# Patient Record
Sex: Female | Born: 1990 | Race: Black or African American | Hispanic: No | Marital: Single | Smoking: Never smoker
Health system: Southern US, Community
[De-identification: ages and names within clinical notes are randomized; demographics above are authoritative.]

## PROBLEM LIST (undated history)

## (undated) DIAGNOSIS — G039 Meningitis, unspecified: Secondary | ICD-10-CM

## (undated) HISTORY — PX: ANTERIOR CRUCIATE LIGAMENT REPAIR: SHX115

---
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
## 2011-01-31 NOTE — Op Note (Signed)
Wanda Reyes, PANCHAL NO.:  [id number]   MEDICAL RECORD NO.:  [id number]          PATIENT TYPE:  AMB   LOCATION:  DSC                          FACILITY:  [facility]   PHYSICIAN:  Mila Homer. Sherlean Foot, M.D. DATE OF BIRTH:  06-Nov-1990   DATE OF PROCEDURE:  10/09/2007  DATE OF DISCHARGE:                               OPERATIVE REPORT   SURGEON:  Mila Homer. Sherlean Foot, M.D.   ASSISTANTArlys John D. Petrarca, P.A.-C.   ANESTHESIA:  General.   PREOP DIAGNOSIS:  Left anterior cruciate ligament tear.   POSTOP DIAGNOSIS:  Left anterior cruciate ligament tear.   PROCEDURE:  Left ACL reconstruction (autograft bone and bone tendon  bone).   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  Informed consent was obtained for autograft ACL reconstruction  from the parents.  Informed consent was obtained from the parents.   DESCRIPTION OF PROCEDURE:  The patient was laid supine and administered  general anesthesia in the supine position.  Left leg was prepped and  draped in the usual sterile fashion.  The bed was broken so that both  knees were at 90 degrees and well-padded.  I then made an inferolateral  portal and took a tour of the knee.  We documented the ACL being torn  off the lateral wall but no chondromalacia or meniscus tears.  I then  removed the scope and exsanguinated the extremity with the Esmarch.  I  elevated the tourniquet to 350 mmHg and set it for an hour.   I then made a midline incision from the inferior pole of the patella to  the tibial tubercle with a #15 blade.  I used a new 15 blade to incise  the peritenon.  I then used a #10 catamaran blade to take the central  one-third of the patella tendon and used the __________  saw to take a  10 x 10 x 25 mm bone plug out of the tibial tubercle and the patella.  The PA then prepared the graft on the back table.  I then went  back  inside the knee with the scope.  I made an inferomedial portal through  the harvest incision.  I removed the old ACL.  I then checked the  menisci again and there were in fact no tears.   I then used the 4.0 mm cylindrical bur to perform aggressive  notchplasty.  I then used the Arthrex system to make our tibial and  femoral tunnels in standard position with the femoral tunnel being 30 mm  in length.  I then passed the graft with the beef needle and placed the  nitinol wire anterior to the bone plug in the femoral tunnel and placed  a 7 x 25 mm metal interference screw.  I then placed the nitinol wire  anterior to the tibial bone plug. We placed a 7 x 25 mm with the knee in  10 degrees of flexion, posterior drawer applied and tensioned on the  graft.  I then went back into the knee and both visually watching as I  did a Drawer as well as probing the ACL it appeared to be a very very  well tensioned and tight.  I then lavaged the knee, closed with some  bone graft in the patella, zero Vicryls in the paratenon, 0 Vicryls in  the buried deep soft tissues, two subcuticular 2-0 Vicryl sutures and  Steri-Strips.  The patient tolerated the procedure well.  I infiltrated  with some Marcaine and morphine mixture and dressed with Xeroform  dressing, sponges, sterile Webril and Ace wrap.   COMPLICATIONS:  None.   DRAINS:  None.           ______________________________  Mila Homer. Sherlean Foot, M.D.     SDL/MEDQ  D:  10/09/2007  T:  10/09/2007  Job:  [id number]

## 2011-06-08 LAB — POCT HEMOGLOBIN-HEMACUE: Hemoglobin: 13.8

## 2012-02-08 ENCOUNTER — Other Ambulatory Visit: Payer: Self-pay | Admitting: Orthopedic Surgery

## 2012-02-08 DIAGNOSIS — M25569 Pain in unspecified knee: Secondary | ICD-10-CM

## 2012-02-09 ENCOUNTER — Ambulatory Visit
Admission: RE | Admit: 2012-02-09 | Discharge: 2012-02-09 | Disposition: A | Discharge status: Home / Self Care | Payer: BC Managed Care – PPO | Source: Ambulatory Visit | Attending: Orthopedic Surgery | Admitting: Orthopedic Surgery

## 2012-02-09 DIAGNOSIS — M25569 Pain in unspecified knee: Secondary | ICD-10-CM

## 2021-05-09 DIAGNOSIS — Z01419 Encounter for gynecological examination (general) (routine) without abnormal findings: Secondary | ICD-10-CM | POA: Diagnosis not present

## 2021-05-09 DIAGNOSIS — R1011 Right upper quadrant pain: Secondary | ICD-10-CM | POA: Diagnosis not present

## 2021-05-09 DIAGNOSIS — N911 Secondary amenorrhea: Secondary | ICD-10-CM | POA: Diagnosis not present

## 2021-05-09 DIAGNOSIS — R102 Pelvic and perineal pain: Secondary | ICD-10-CM | POA: Diagnosis not present

## 2021-05-09 DIAGNOSIS — Z113 Encounter for screening for infections with a predominantly sexual mode of transmission: Secondary | ICD-10-CM | POA: Diagnosis not present

## 2021-05-09 DIAGNOSIS — Z6841 Body Mass Index (BMI) 40.0 and over, adult: Secondary | ICD-10-CM | POA: Diagnosis not present

## 2021-05-25 ENCOUNTER — Emergency Department (HOSPITAL_BASED_OUTPATIENT_CLINIC_OR_DEPARTMENT_OTHER)
Admission: EM | Admit: 2021-05-25 | Discharge: 2021-05-25 | Disposition: A | Discharge status: Home / Self Care | Payer: PRIVATE HEALTH INSURANCE | Attending: Emergency Medicine | Admitting: Emergency Medicine

## 2021-05-25 ENCOUNTER — Encounter (HOSPITAL_BASED_OUTPATIENT_CLINIC_OR_DEPARTMENT_OTHER): Payer: Self-pay

## 2021-05-25 ENCOUNTER — Other Ambulatory Visit: Payer: Self-pay

## 2021-05-25 DIAGNOSIS — R110 Nausea: Secondary | ICD-10-CM | POA: Diagnosis not present

## 2021-05-25 DIAGNOSIS — R1084 Generalized abdominal pain: Secondary | ICD-10-CM | POA: Diagnosis not present

## 2021-05-25 DIAGNOSIS — R197 Diarrhea, unspecified: Secondary | ICD-10-CM | POA: Diagnosis present

## 2021-05-25 LAB — CBC
HCT: 51 % — ABNORMAL HIGH (ref 36.0–46.0)
Hemoglobin: 17.5 g/dL — ABNORMAL HIGH (ref 12.0–15.0)
MCH: 28.8 pg (ref 26.0–34.0)
MCHC: 34.3 g/dL (ref 30.0–36.0)
MCV: 84 fL (ref 80.0–100.0)
Platelets: 289 10*3/uL (ref 150–400)
RBC: 6.07 MIL/uL — ABNORMAL HIGH (ref 3.87–5.11)
RDW: 12.3 % (ref 11.5–15.5)
WBC: 19 10*3/uL — ABNORMAL HIGH (ref 4.0–10.5)
nRBC: 0 % (ref 0.0–0.2)

## 2021-05-25 LAB — COMPREHENSIVE METABOLIC PANEL
ALT: 15 U/L (ref 0–44)
AST: 25 U/L (ref 15–41)
Albumin: 4.5 g/dL (ref 3.5–5.0)
Alkaline Phosphatase: 47 U/L (ref 38–126)
Anion gap: 7 (ref 5–15)
BUN: 10 mg/dL (ref 6–20)
CO2: 26 mmol/L (ref 22–32)
Calcium: 9.4 mg/dL (ref 8.9–10.3)
Chloride: 104 mmol/L (ref 98–111)
Creatinine, Ser: 1.14 mg/dL — ABNORMAL HIGH (ref 0.44–1.00)
GFR, Estimated: >60 mL/min (ref >60)
Glucose, Bld: 107 mg/dL — ABNORMAL HIGH (ref 70–99)
Potassium: 3.7 mmol/L (ref 3.5–5.1)
Sodium: 137 mmol/L (ref 135–145)
Total Bilirubin: 0.6 mg/dL (ref 0.3–1.2)
Total Protein: 8.1 g/dL (ref 6.5–8.1)

## 2021-05-25 LAB — LIPASE, BLOOD: Lipase: 33 U/L (ref 11–51)

## 2021-05-25 MED ORDER — LACTATED RINGERS IV BOLUS
1000.0000 mL | Freq: Once | INTRAVENOUS | Status: AC
  Administered 2021-05-25: 1000 mL via INTRAVENOUS

## 2021-05-25 MED ORDER — ONDANSETRON 4 MG PO TBDP: 4.0000 mg | ORAL_TABLET | Freq: Three times a day (TID) | ORAL | 0 refills | Status: AC | PRN

## 2021-05-25 NOTE — ED Provider Notes (Signed)
[facility] EMERGENCY DEPARTMENT Provider Note   CSN: [id number] Arrival date & time: 05/25/21  1015     History Chief Complaint  Patient presents with   Diarrhea    Wanda Reyes is a 30 y.o. female.   Diarrhea Associated symptoms: no abdominal pain, no arthralgias, no chills, no diaphoresis, no fever, no headaches, no myalgias and no vomiting   Patient presents for diarrhea.  Onset was 0600.  Diarrhea is described as watery.  She has had nausea but has not vomited.  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  While at work, she had continued watery diarrhea.  Frequency of her chest to the bathroom got so frequent that she decided to leave work.  She presents to the ED due to the persistent symptoms.  Patient states that during these episodes of diarrhea, she would have some crampy generalized abdominal pain.  She states that currently, her abdominal pain is subsided.  She denies any current nausea.  She does not have any history of GI abnormalities.  She typically does not get diarrhea.  As far as suspicious food, she does state that she had a chicken salad from [employer] and, in hindsight, the chicken did not taste right.  She is also been experimenting with restriction diet and cutting out a lot of foods.  She broke these restrictions last night and had some pizza.  She denies any recent urinary symptoms.  She was diagnosed with chlamydia 2 weeks ago.  She completed a course of doxycycline with the last dose being on Thursday.    History reviewed. No pertinent past medical history.  There are no problems to display for this patient.   History reviewed. No pertinent surgical history.   OB History   No obstetric history on file.     History reviewed. No pertinent family history.     Home Medications Prior to Admission medications   Medication Sig Start Date End Date Taking? Authorizing Provider  ondansetron (ZOFRAN ODT) 4 MG disintegrating tablet Take 1  tablet (4 mg total) by mouth every 8 (eight) hours as needed for up to 5 doses for nausea or vomiting. 05/25/21  Yes Gloris Manchester, MD    Allergies    Patient has no known allergies.  Review of Systems   Review of Systems  Constitutional:  Negative for activity change, chills, diaphoresis, fatigue and fever.  HENT:  Negative for ear pain and sore throat.   Eyes:  Negative for pain and visual disturbance.  Respiratory:  Negative for cough and shortness of breath.   Cardiovascular:  Negative for chest pain and palpitations.  Gastrointestinal:  Positive for diarrhea and nausea. Negative for abdominal distention, abdominal pain, blood in stool and vomiting.  Genitourinary:  Negative for dysuria, flank pain, frequency, hematuria, pelvic pain, urgency and vaginal discharge.  Musculoskeletal:  Negative for arthralgias, back pain, myalgias and neck pain.  Skin:  Negative for color change and rash.  Neurological:  Negative for dizziness, seizures, syncope, weakness, light-headedness, numbness and headaches.  All other systems reviewed and are negative.  Physical Exam Updated Vital Signs BP 110/81 (BP Location: Right Arm)   Pulse 77   Temp 98.3 F (36.8 C) (Oral)   Resp 18   Wt 105.7 kg   SpO2 100%   Physical Exam Vitals and nursing note reviewed.  Constitutional:      General: She is not in acute distress.    Appearance: Normal appearance. She is well-developed. She  is not ill-appearing, toxic-appearing or diaphoretic.  HENT:     Head: Normocephalic and atraumatic.     Right Ear: External ear normal.     Left Ear: External ear normal.     Nose: Nose normal.     Mouth/Throat:     Mouth: Mucous membranes are moist.     Pharynx: Oropharynx is clear.  Eyes:     Conjunctiva/sclera: Conjunctivae normal.  Cardiovascular:     Rate and Rhythm: Normal rate and regular rhythm.     Heart sounds: No murmur heard. Pulmonary:     Effort: Pulmonary effort is normal. No respiratory distress.      Breath sounds: Normal breath sounds.  Abdominal:     General: There is no distension.     Palpations: Abdomen is soft.     Tenderness: There is no abdominal tenderness.  Musculoskeletal:     Cervical back: Neck supple.     Right lower leg: No edema.     Left lower leg: No edema.  Skin:    General: Skin is warm and dry.     Coloration: Skin is not jaundiced or pale.  Neurological:     General: No focal deficit present.     Mental Status: She is alert and oriented to person, place, and time.     Cranial Nerves: No cranial nerve deficit.     Sensory: No sensory deficit.     Motor: No weakness.  Psychiatric:        Mood and Affect: Mood normal.        Behavior: Behavior normal.        Thought Content: Thought content normal.        Judgment: Judgment normal.    ED Results / Procedures / Treatments   Labs (all labs ordered are listed, but only abnormal results are displayed) Labs Reviewed  COMPREHENSIVE METABOLIC PANEL - Abnormal; Notable for the following components:      Result Value   Glucose, Bld 107 (*)    Creatinine, Ser 1.14 (*)    All other components within normal limits  CBC - Abnormal; Notable for the following components:   WBC 19.0 (*)    RBC 6.07 (*)    Hemoglobin 17.5 (*)    HCT 51.0 (*)    All other components within normal limits  LIPASE, BLOOD    EKG None  Radiology No results found.  Procedures Procedures   Medications Ordered in ED Medications  lactated ringers bolus 1,000 mL (0 mLs Intravenous Stopped 05/25/21 1230)    ED Course  I have reviewed the triage vital signs and the nursing notes.  Pertinent labs & imaging results that were available during my care of the patient were reviewed by me and considered in my medical decision making (see chart for details).    MDM Rules/Calculators/A&P                           Patient presents for 5 hours of watery diarrhea.  She has had associated generalized crampy abdominal pain as well as some  nausea without vomiting.  She denies any fevers, chills, urinary symptoms, or any other areas of discomfort.  Well-appearing upon arrival.  Vital signs are reassuring.  Laboratory work-up was initiated to assess for electrolyte abnormalities.  Patient has had decreased p.o. intake today and so IV fluids were ordered to replace fluid losses.  On arrival, patient declined any antiemetic due to improved  symptoms.  Labs showed normal electrolytes.  Patient's creatinine was 1.14, which was slightly increased from labs taken 2 weeks ago, at which time creatinine was 1.05.  CBC showed a leukocytosis of 19.0.  Her hemoglobin was also elevated from baseline and there is likely some hemoconcentration present, however, this would not fully explain her high white count.  She has not had any recent antibiotic use other than doxycycline.  Given the acute onset of her symptoms today, low suspicion for C. difficile.  On reassessment, patient reported resolution of abdominal pain, resolution of nausea, and improved diarrhea.  During her stay in the ED, she had only 1 episode of diarrhea over 4 hours.  She was informed of her elevated white count.  She was encouraged to follow-up with her primary care doctor for recheck of labs and possible stool studies.  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  I do feel that she is reliable to follow-up with her regular doctor and/or return to the ED for any continued symptoms.  Given her improvement while in the ED, patient did feel comfortable going home.  She was discharged in stable condition.  Final Clinical Impression(s) / ED Diagnoses Final diagnoses:  Diarrhea, unspecified type    Rx / DC Orders ED Discharge Orders          Ordered    ondansetron (ZOFRAN ODT) 4 MG disintegrating tablet  Every 8 hours PRN        05/25/21 1347             Gloris Manchester, MD 05/26/21 1659

## 2021-05-25 NOTE — ED Triage Notes (Signed)
Diarrhea that started about 0600 today, went about 5 times. Abdominal pain with it as well.

## 2021-12-19 ENCOUNTER — Emergency Department (HOSPITAL_BASED_OUTPATIENT_CLINIC_OR_DEPARTMENT_OTHER)
Admission: EM | Admit: 2021-12-19 | Discharge: 2021-12-19 | Disposition: A | Discharge status: Home / Self Care | Payer: PRIVATE HEALTH INSURANCE | Attending: Emergency Medicine | Admitting: Emergency Medicine

## 2021-12-19 ENCOUNTER — Emergency Department (HOSPITAL_BASED_OUTPATIENT_CLINIC_OR_DEPARTMENT_OTHER): Payer: PRIVATE HEALTH INSURANCE

## 2021-12-19 ENCOUNTER — Other Ambulatory Visit: Payer: Self-pay

## 2021-12-19 ENCOUNTER — Encounter (HOSPITAL_BASED_OUTPATIENT_CLINIC_OR_DEPARTMENT_OTHER): Payer: Self-pay

## 2021-12-19 DIAGNOSIS — R1030 Lower abdominal pain, unspecified: Secondary | ICD-10-CM

## 2021-12-19 DIAGNOSIS — Z8742 Personal history of other diseases of the female genital tract: Secondary | ICD-10-CM | POA: Insufficient documentation

## 2021-12-19 DIAGNOSIS — N898 Other specified noninflammatory disorders of vagina: Secondary | ICD-10-CM | POA: Diagnosis not present

## 2021-12-19 DIAGNOSIS — D582 Other hemoglobinopathies: Secondary | ICD-10-CM | POA: Diagnosis not present

## 2021-12-19 HISTORY — DX: Meningitis, unspecified: G03.9

## 2021-12-19 LAB — WET PREP, GENITAL
Clue Cells Wet Prep HPF POC: NONE SEEN
Sperm: NONE SEEN
Trich, Wet Prep: NONE SEEN
WBC, Wet Prep HPF POC: <10 (ref <10)
Yeast Wet Prep HPF POC: NONE SEEN

## 2021-12-19 LAB — CBC WITH DIFFERENTIAL/PLATELET
Abs Immature Granulocytes: 0.01 10*3/uL (ref 0.00–0.07)
Basophils Absolute: 0.1 10*3/uL (ref 0.0–0.1)
Basophils Relative: 1 %
Eosinophils Absolute: 0.1 10*3/uL (ref 0.0–0.5)
Eosinophils Relative: 1 %
HCT: 49.6 % — ABNORMAL HIGH (ref 36.0–46.0)
Hemoglobin: 17.6 g/dL — ABNORMAL HIGH (ref 12.0–15.0)
Immature Granulocytes: 0 %
Lymphocytes Relative: 31 %
Lymphs Abs: 2 10*3/uL (ref 0.7–4.0)
MCH: 29.6 pg (ref 26.0–34.0)
MCHC: 35.5 g/dL (ref 30.0–36.0)
MCV: 83.4 fL (ref 80.0–100.0)
Monocytes Absolute: 0.5 10*3/uL (ref 0.1–1.0)
Monocytes Relative: 8 %
Neutro Abs: 3.9 10*3/uL (ref 1.7–7.7)
Neutrophils Relative %: 59 %
Platelets: 235 10*3/uL (ref 150–400)
RBC: 5.95 MIL/uL — ABNORMAL HIGH (ref 3.87–5.11)
RDW: 12.7 % (ref 11.5–15.5)
WBC: 6.6 10*3/uL (ref 4.0–10.5)
nRBC: 0 % (ref 0.0–0.2)

## 2021-12-19 LAB — COMPREHENSIVE METABOLIC PANEL
ALT: 21 U/L (ref 0–44)
AST: 27 U/L (ref 15–41)
Albumin: 3.8 g/dL (ref 3.5–5.0)
Alkaline Phosphatase: 44 U/L (ref 38–126)
Anion gap: 6 (ref 5–15)
BUN: 12 mg/dL (ref 6–20)
CO2: 26 mmol/L (ref 22–32)
Calcium: 9.1 mg/dL (ref 8.9–10.3)
Chloride: 104 mmol/L (ref 98–111)
Creatinine, Ser: 1.08 mg/dL — ABNORMAL HIGH (ref 0.44–1.00)
GFR, Estimated: >60 mL/min (ref >60)
Glucose, Bld: 124 mg/dL — ABNORMAL HIGH (ref 70–99)
Potassium: 3.6 mmol/L (ref 3.5–5.1)
Sodium: 136 mmol/L (ref 135–145)
Total Bilirubin: 0.8 mg/dL (ref 0.3–1.2)
Total Protein: 7.5 g/dL (ref 6.5–8.1)

## 2021-12-19 LAB — URINALYSIS, ROUTINE W REFLEX MICROSCOPIC
Bilirubin Urine: NEGATIVE
Glucose, UA: NEGATIVE mg/dL
Hgb urine dipstick: NEGATIVE
Ketones, ur: NEGATIVE mg/dL
Nitrite: NEGATIVE
Protein, ur: NEGATIVE mg/dL
Specific Gravity, Urine: 1.015 (ref 1.005–1.030)
pH: 7 (ref 5.0–8.0)

## 2021-12-19 LAB — PREGNANCY, URINE: Preg Test, Ur: NEGATIVE

## 2021-12-19 LAB — URINALYSIS, MICROSCOPIC (REFLEX)

## 2021-12-19 LAB — LIPASE, BLOOD: Lipase: 32 U/L (ref 11–51)

## 2021-12-19 MED ORDER — KETOROLAC TROMETHAMINE 60 MG/2ML IM SOLN: 60.0000 mg | Freq: Once | INTRAMUSCULAR | Status: DC

## 2021-12-19 MED ORDER — KETOROLAC TROMETHAMINE 30 MG/ML IJ SOLN
30.0000 mg | Freq: Once | INTRAMUSCULAR | Status: AC
  Administered 2021-12-19: 30 mg via INTRAVENOUS
  Filled 2021-12-19: qty 1

## 2021-12-19 NOTE — ED Triage Notes (Signed)
Pt arrives ambulatory to ED with reports of severe cramping, states that she was been seen by her OB in the past and told she has PCOS. Pt reports she has not had a cycle in 10 months. Pt reports negative pregnancy test.  ?

## 2021-12-19 NOTE — ED Notes (Signed)
Called [facility] to inform of patient transferring for [place]. ?

## 2021-12-19 NOTE — ED Provider Notes (Addendum)
?[facility] EMERGENCY DEPARTMENT ?Provider Note ? ? ?CSN: [id number] ?Arrival date & time: 12/19/21  8250 ? ?  ? ?History ? ?Chief Complaint  ?Patient presents with  ? Abdominal Pain  ? ? ?Wanda Reyes is a 31 y.o. female. ? ?HPI ? ?  ? ?31 year old female with history of PCOS presents with concern for lower abdominal cramping. ? ?Clear discharge, no odor ? ?5-7 minute schedule, sharp stabbing pains over ovaries and into uterus, and sometimes feels like vaginal wall contracting. Maybe a little worse on left side.  Has history of this happening for 6-8 hour spans once a month for several/3 years (started late 2020) But this week it has been going on since last Sunday.  During these episodes has difficulty urinating.  Feels a stinging on both sides. 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  ? ?Some constipation but having regular BM ?No nausea or vomiting  ?No fevers ?No vaginal bleeding ?No pain into back, a little achy but not significantly ? ?Home Medications ?Prior to Admission medications   ?Medication Sig Start Date End Date Taking? Authorizing Provider  ?ondansetron (ZOFRAN ODT) 4 MG disintegrating tablet Take 1 tablet (4 mg total) by mouth every 8 (eight) hours as needed for up to 5 doses for nausea or vomiting. 05/25/21   Gloris Manchester, MD  ?   ? ?Allergies    ?Patient has no known allergies.   ? ?Review of Systems   ?Review of Systems ?See above ?Physical Exam ?Updated Vital Signs ?BP (!) 155/104 (BP Location: Left Arm)   Pulse 77   Temp 97.7 ?F (36.5 ?C) (Oral)   Resp 18   Ht 5\' 2"  (1.575 m)   Wt 104.8 kg   LMP 02/16/2021 Comment: neg upreg in er today.  SpO2 100%   BMI 42.25 kg/m?  ?Physical Exam ?Vitals and nursing note reviewed.  ?Constitutional:   ?   General: She is not in acute distress. ?   Appearance: She is well-developed. She is not  diaphoretic.  ?HENT:  ?   Head: Normocephalic and atraumatic.  ?Eyes:  ?   Conjunctiva/sclera: Conjunctivae normal.  ?Cardiovascular:  ?   Rate and Rhythm: Normal rate and regular rhythm.  ?   Heart sounds: Normal heart sounds. No murmur heard. ?  No friction rub. No gallop.  ?Pulmonary:  ?   Effort: Pulmonary effort is normal. No respiratory distress.  ?   Breath sounds: Normal breath sounds. No wheezing or rales.  ?Abdominal:  ?   General: There is no distension.  ?   Palpations: Abdomen is soft.  ?   Tenderness: There is abdominal tenderness (mild suprapubic). There is no guarding.  ?Genitourinary: ?   Vagina: Vaginal discharge present.  ?   Cervix: Discharge present. No cervical motion tenderness.  ?   Uterus: Not enlarged and not tender.   ?   Adnexa:     ?   Right: No tenderness.      ?   Left: No tenderness.    ?Musculoskeletal:     ?   General: No tenderness.  ?   Cervical back: Normal range of motion.  ?Skin: ?   General: Skin is warm and dry.  ?   Findings: No erythema or rash.  ?Neurological:  ?   Mental Status: She is alert and  oriented to person, place, and time.  ? ? ?ED Results / Procedures / Treatments   ?Labs ?(all labs ordered are listed, but only abnormal results are displayed) ?Labs Reviewed  ?URINALYSIS, ROUTINE W REFLEX MICROSCOPIC - Abnormal; Notable for the following components:  ?    Result Value  ? Leukocytes,Ua SMALL (*)   ? All other components within normal limits  ?CBC WITH DIFFERENTIAL/PLATELET - Abnormal; Notable for the following components:  ? RBC 5.95 (*)   ? Hemoglobin 17.6 (*)   ? HCT 49.6 (*)   ? All other components within normal limits  ?COMPREHENSIVE METABOLIC PANEL - Abnormal; Notable for the following components:  ? Glucose, Bld 124 (*)   ? Creatinine, Ser 1.08 (*)   ? All other components within normal limits  ?URINALYSIS, MICROSCOPIC (REFLEX) - Abnormal; Notable for the following components:  ? Bacteria, UA RARE (*)   ? All other components within normal limits  ?WET  PREP, GENITAL  ?PREGNANCY, URINE  ?LIPASE, BLOOD  ?GC/CHLAMYDIA PROBE AMP [facility]  ? ? ?EKG ?None ? ?Radiology ?CT Renal Stone Study ? ?Result Date: 12/19/2021 ?CLINICAL DATA:  Flank pain, kidney stone suspected. One week of pelvic cramping. History of PCOS. EXAM: CT ABDOMEN AND PELVIS WITHOUT CONTRAST TECHNIQUE: Multidetector CT imaging of the abdomen and pelvis was performed following the standard protocol without IV contrast. RADIATION DOSE REDUCTION: This exam was performed according to the departmental dose-optimization program which includes automated exposure control, adjustment of the mA and/or kV according to patient size and/or use of iterative reconstruction technique. COMPARISON:  Abdominal ultrasound May 30, 2021 FINDINGS: Lower chest: No acute abnormality. Hepatobiliary: No suspicious hepatic lesion. Gallbladder is unremarkable. No biliary ductal dilation. Pancreas: No pancreatic ductal dilation or evidence of acute inflammation. Spleen: No splenomegaly or focal splenic lesion. Adrenals/Urinary Tract: Bilateral adrenal glands appear normal. No hydronephrosis. No renal, ureteral or bladder calculi identified. The right kidney appears slightly hypodense in comparison to the left which is nonspecific and may reflect sequela of recently passed stone, ascending urinary tract infection or artifact. Symmetric wall thickening of an incompletely distended urinary bladder. Stomach/Bowel: Stomach is unremarkable for degree of distension. No pathologic dilation of small or large bowel. The appendix and terminal ileum appear normal. No evidence of acute bowel inflammation. Vascular/Lymphatic: Normal caliber abdominal aorta. No pathologically enlarged abdominal or pelvic lymph nodes. Reproductive: Uterus and bilateral adnexa are unremarkable in noncontrast CT appearance for reproductive age female. Other: No significant abdominopelvic free fluid. Musculoskeletal: No acute or significant osseous  findings. IMPRESSION: 1. The right kidney appears slightly hypodense in comparison to the left which is nonspecific and may reflect sequela of recently passed stone, ascending urinary tract infection or artifact. Correlation with urinalysis is recommended. 2. Symmetric wall thickening of an incompletely distended urinary bladder. Correlate with urinalysis to exclude cystitis. Electronically Signed   By: Maudry MayhewJeffrey  Waltz M.D.   On: 12/19/2021 10:15   ? ?Procedures ?Procedures  ? ? ?Medications Ordered in ED ?Medications  ?ketorolac (TORADOL) 30 MG/ML injection 30 mg (30 mg Intravenous Given 12/19/21 0936)  ? ? ?ED Course/ Medical Decision Making/ A&P ?  ?                        ?Medical Decision Making ?Amount and/or Complexity of Data Reviewed ?Labs: ordered. ?Radiology: ordered. ? ?Risk ?Prescription drug management. ? ? ?31 year old female with history of PCOS presents with concern for lower abdominal cramping.  ? ?DDx  includes appendicitis, pancreatitis, cholecystitis, pyelonephritis, nephrolithiasis, diverticulitis, PID, ovarian torsion, ectopic pregnancy, and tuboovarian abscess.  We do not currently have [place] available and given one week of symptoms at this time will start with labs and CT to evaluate for nephrolithiasis. ? ?UA negative. Pregnancy test negative.  ?Labs personally evaluated and interpreted by me show no significant acute abnormalities, mildly elevated hgb, no sign of pancreatitis or hepatitis.  ? ?CT stone study shows right kidney appearing slightly hypodense in comparison to the left which is nonspecific and may reflect sequela of recently passed stone or ascending urinary tract infection, and symmetric wall thickening evident incompletely distended urinary bladder.  Her urinalysis is not consistent with a urinary tract infection.  Do recommend primary care physician follow-up regarding these findings. ? ?Pelvic exam with discharge but without significant uterine tenderness to suggest PID. ? ?Given  her history of PCOS and severe waxing and waning pelvic pain, do feel it is prudent to get a ultrasound.  Discussed with patient that given 1 week of symptoms, no current ovarian tenderness, my overall suspicion for

## 2021-12-20 LAB — GC/CHLAMYDIA PROBE AMP (~~LOC~~) NOT AT ARMC
Chlamydia: NEGATIVE
Comment: NEGATIVE
Comment: NORMAL
Neisseria Gonorrhea: NEGATIVE

## 2023-11-27 IMAGING — US US PELVIS COMPLETE TRANSABD/TRANSVAG W DUPLEX AND/OR DOPPLER
1 series · 13 of 25 positions shown · non-contrast
Comparison: Same day CT

CLINICAL DATA: Abdominal pain, cramping

EXAM:
TRANSABDOMINAL AND TRANSVAGINAL ULTRASOUND OF PELVIS
DOPPLER ULTRASOUND OF OVARIES
TECHNIQUE: Both transabdominal and transvaginal ultrasound examinations of the
pelvis were performed. Transabdominal technique was performed for
global imaging of the pelvis including uterus, ovaries, adnexal
regions, and pelvic cul-de-sac.
It was necessary to proceed with endovaginal exam following the
transabdominal exam to visualize the ovaries. Color and duplex
Doppler ultrasound was utilized to evaluate blood flow to the
ovaries.

[Series 1: us pelvis complete transabd/transvag w duplex and/ · 13 of 102 slices shown]
[im 1/102]
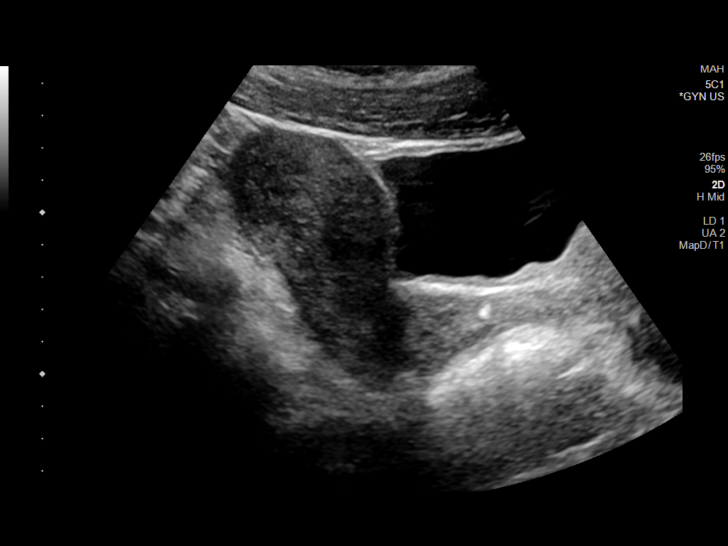
[im 9/102]
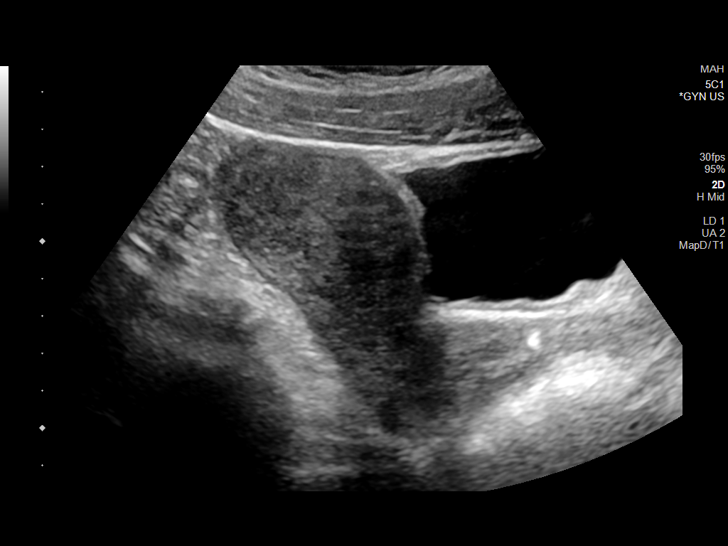
[im 17/102]
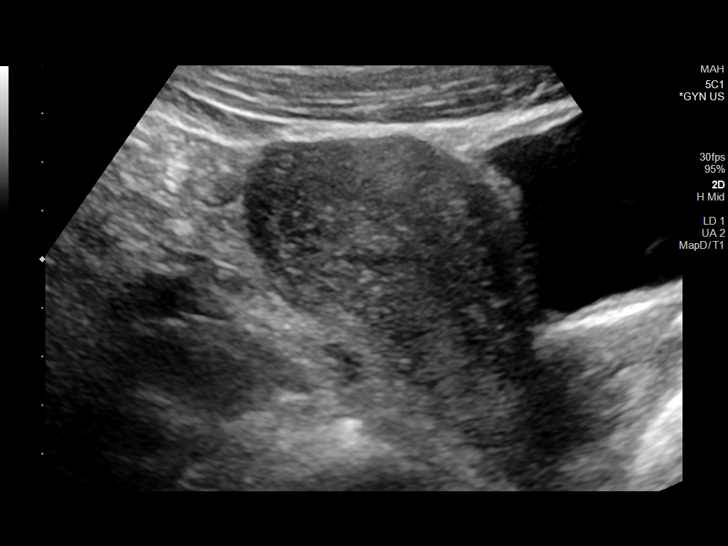
[im 26/102]
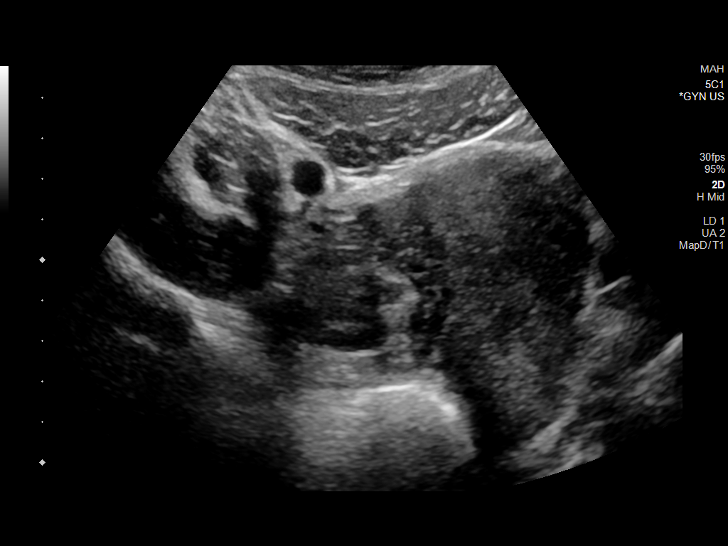
[im 34/102]
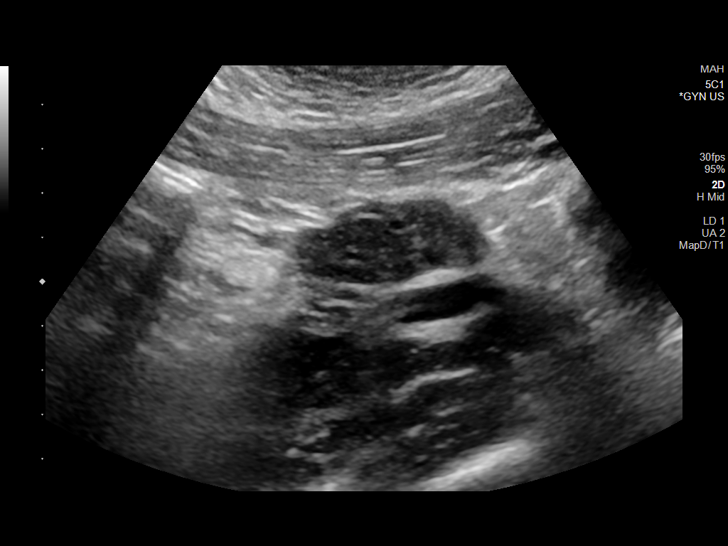
[im 43/102]
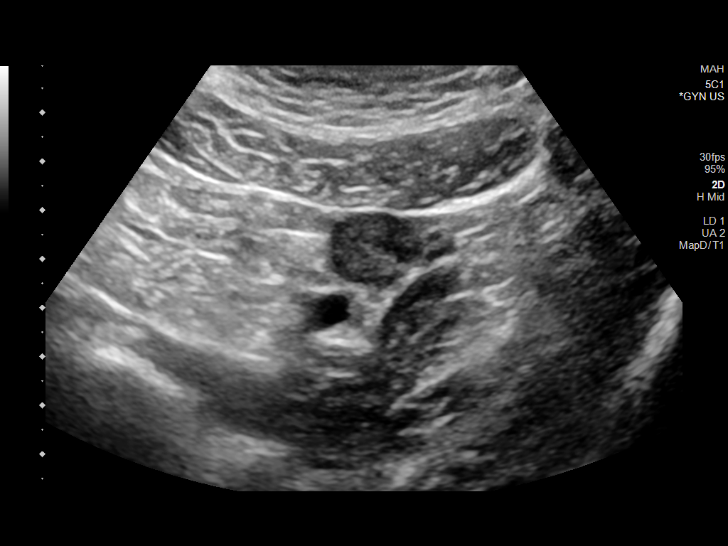
[im 51/102]
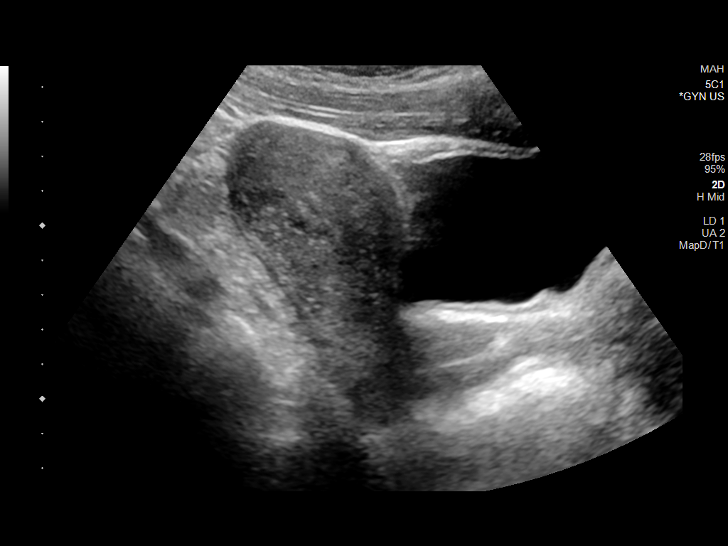
[im 59/102]
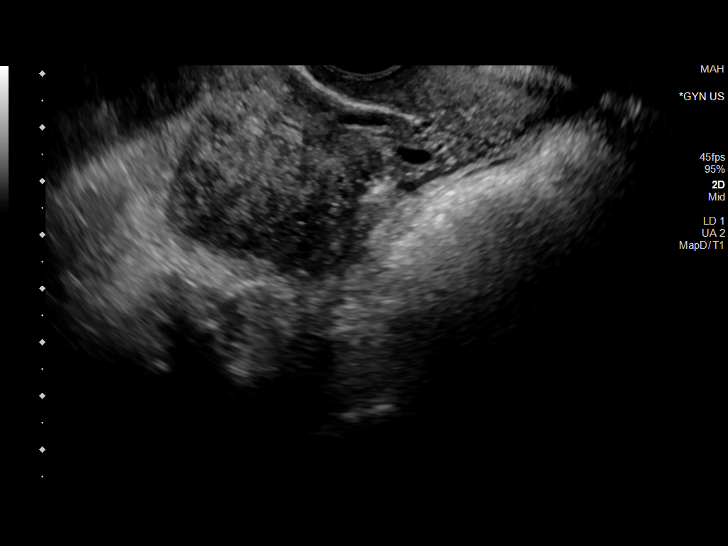
[im 68/102]
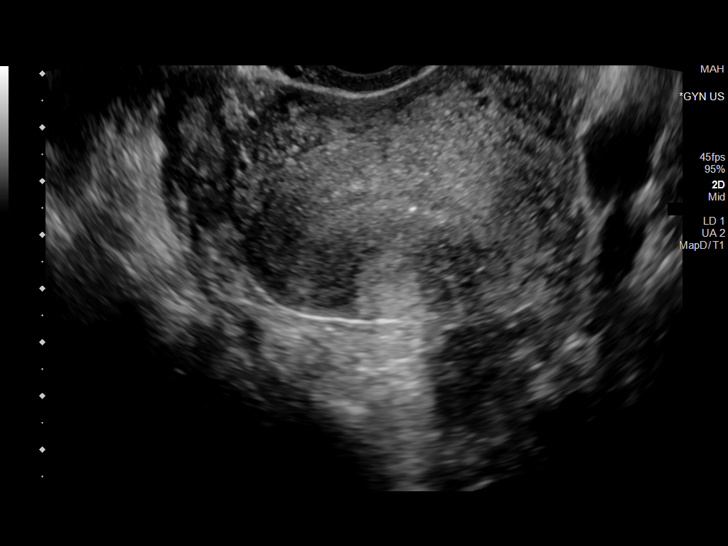
[im 76/102]
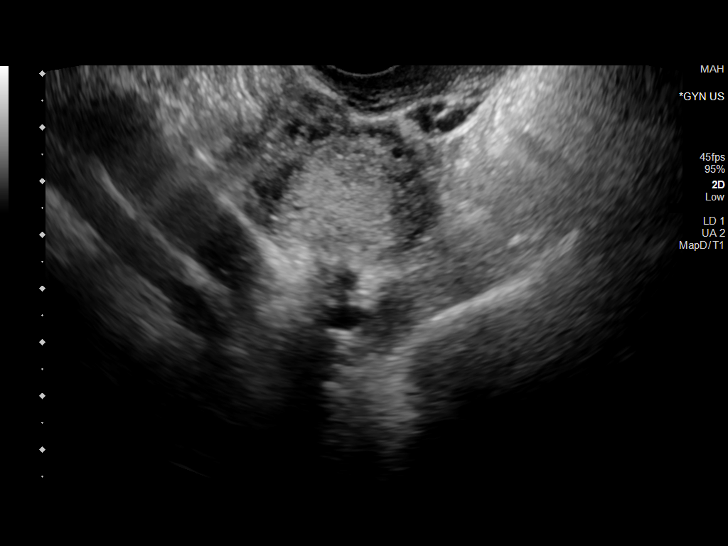
[im 85/102]
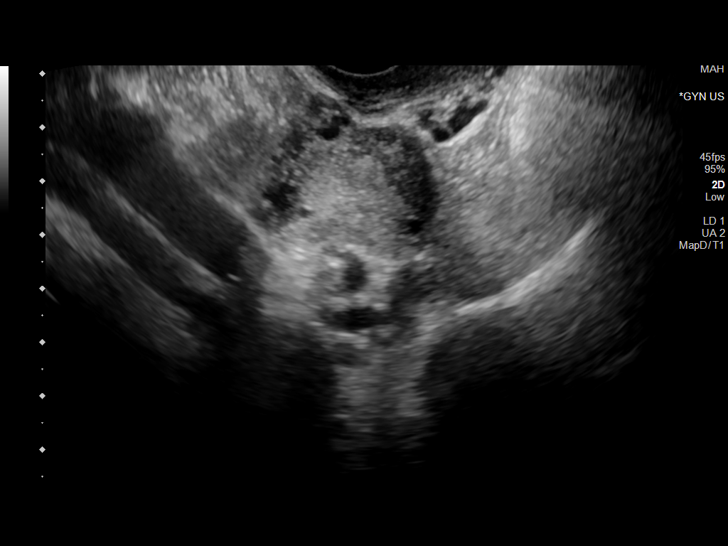
[im 93/102]
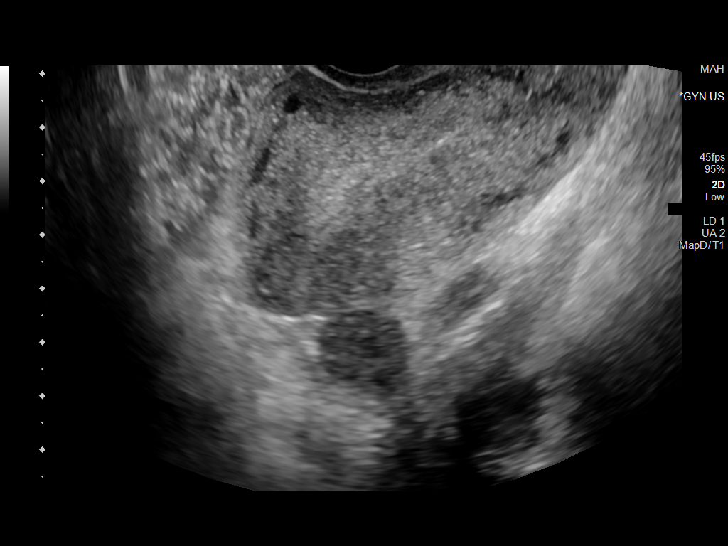
[im 102/102]
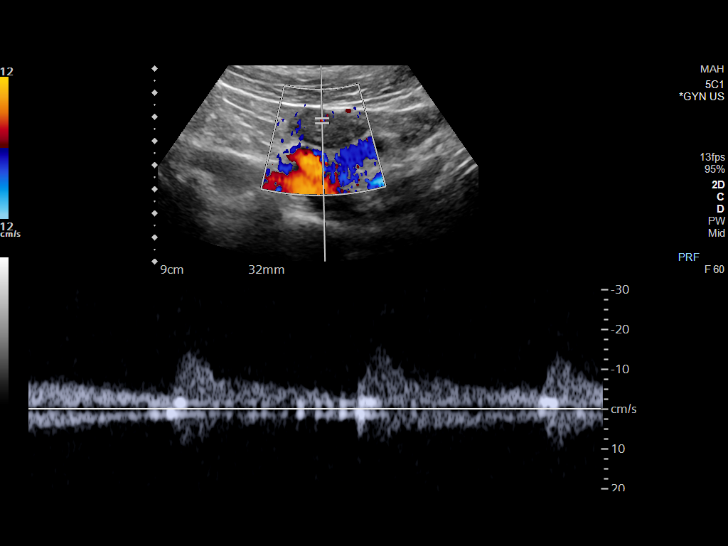

[13 of 25 positions shown; findings below may reference images not displayed]

FINDINGS: Uterus

Measurements: 9.4 x 4.3 x 6.7 cm = volume: 142 mL. No fibroids or
other mass visualized.

Endometrium

Thickness: 5 mm.  No focal abnormality visualized.

Right ovary

Measurements: 4.1 x 2.1 x 3.1 cm = volume: 14 mL. Normal
appearance/no adnexal mass.

Left ovary

Measurements: 3.2 x 1.8 x 1.7 cm = volume: 8 mL. Normal
appearance/no adnexal mass.

Pulsed Doppler evaluation of both ovaries demonstrates normal
low-resistance arterial and venous waveforms.

Other findings

No abnormal free fluid.
IMPRESSION: Normal ultrasound of the pelvis.  No evidence of adnexal torsion.

## 2023-11-27 IMAGING — CT CT RENAL STONE PROTOCOL
2 of 4 series · 16 of 46 positions shown, 18 images · non-contrast
Comparison: Abdominal ultrasound May 30, 2021

CLINICAL DATA: Flank pain, kidney stone suspected. One week of
pelvic cramping. History of PCOS.



[Series 2: axial st · axial · 0.98mm/px · z∈[-456,-56]mm · 13 of 88 slices shown, 15 images]
[im 4/88  soft-tissue]
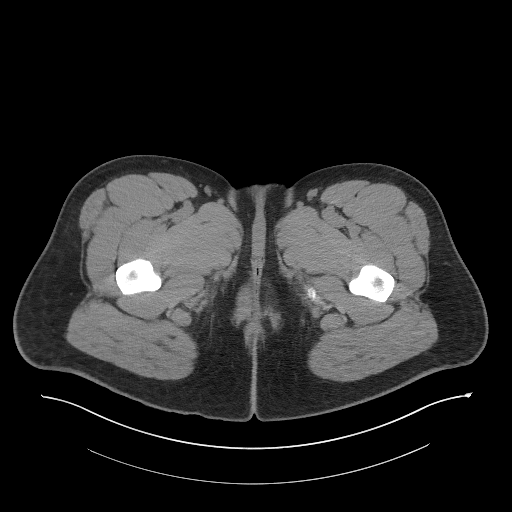
[im 4/88  bone]
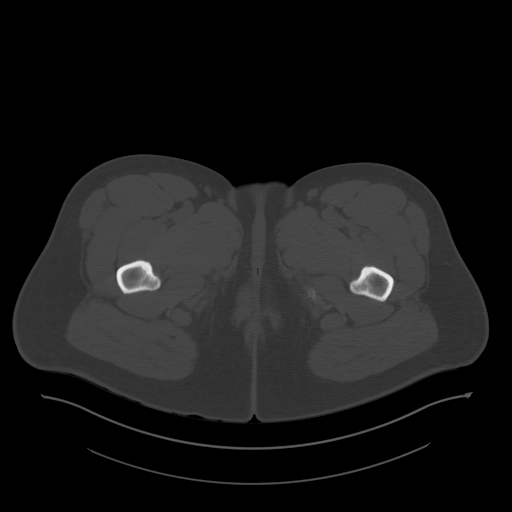
[im 11/88  soft-tissue]
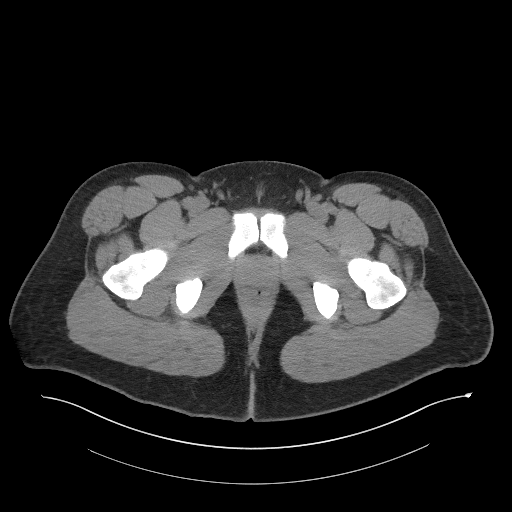
[im 18/88  soft-tissue]
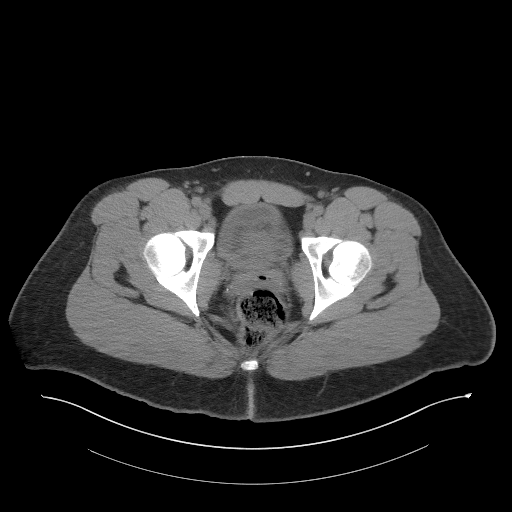
[im 25/88  soft-tissue]
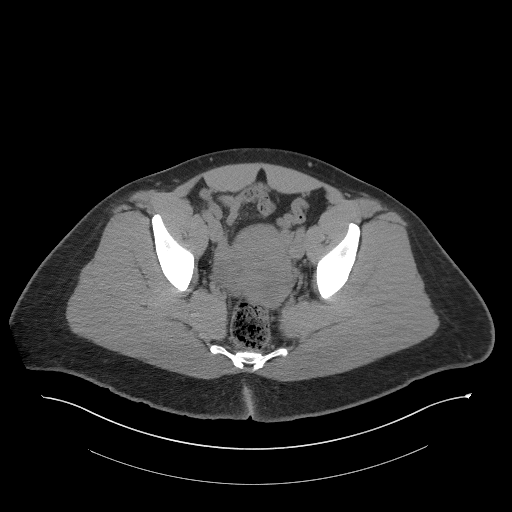
[im 32/88  soft-tissue]
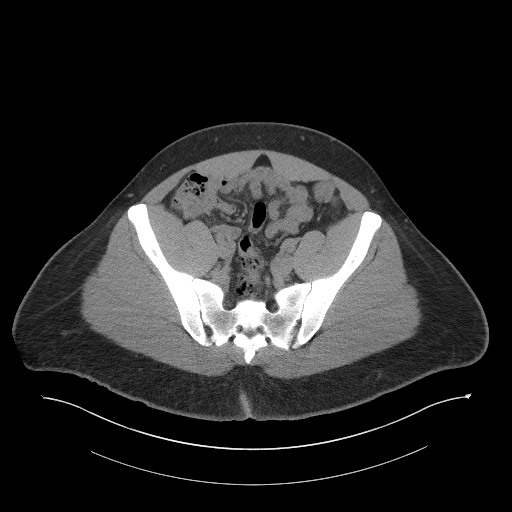
[im 39/88  soft-tissue]
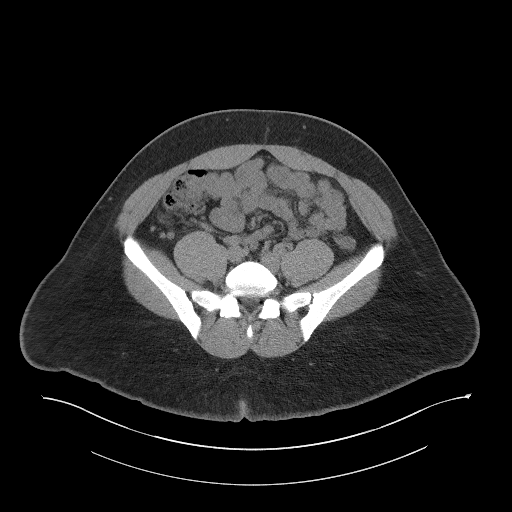
[im 46/88  soft-tissue]
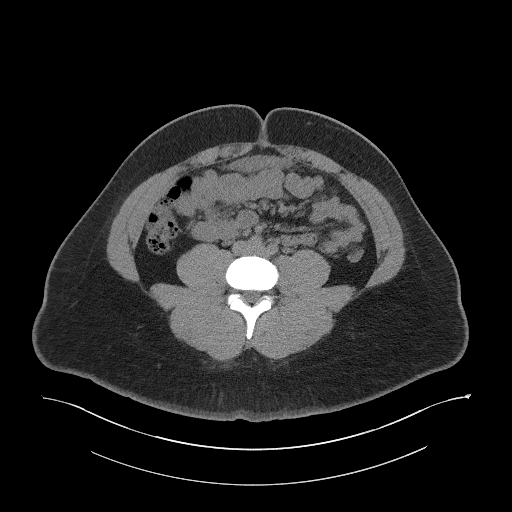
[im 49/88  soft-tissue]
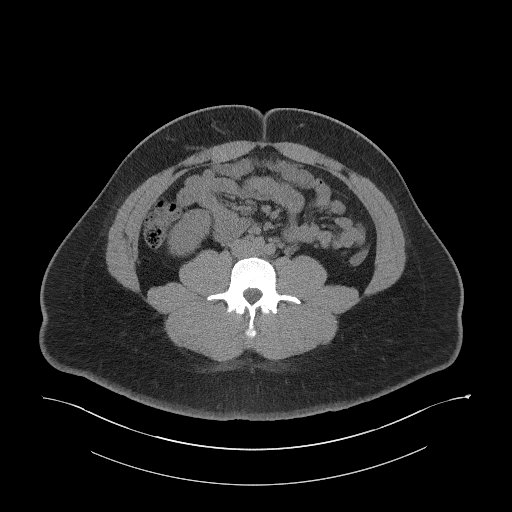
[im 56/88  soft-tissue]
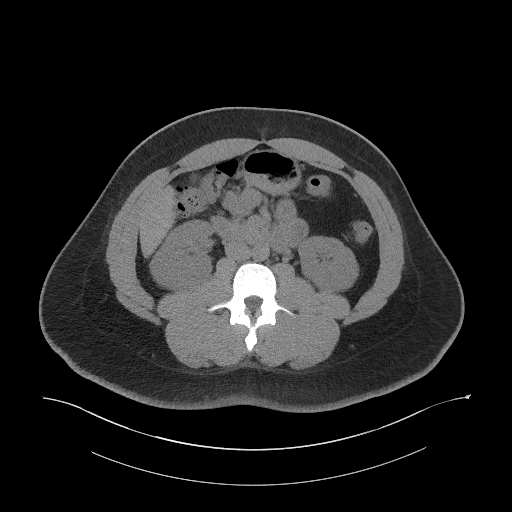
[im 56/88  bone]
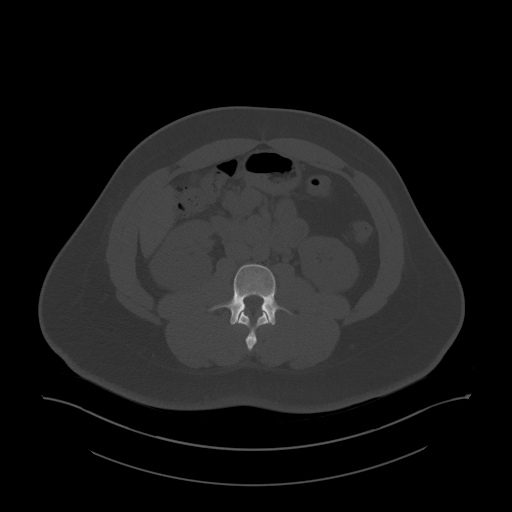
[im 63/88  soft-tissue]
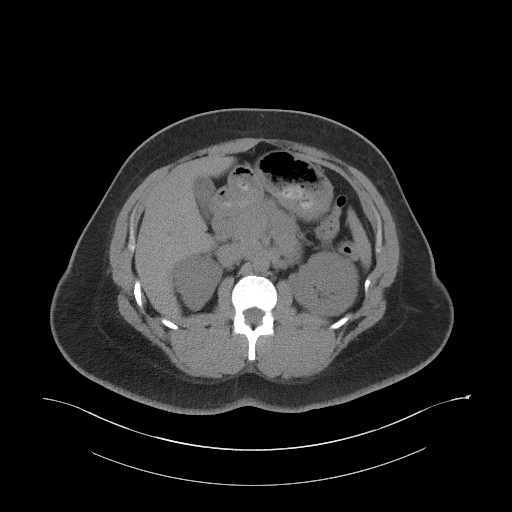
[im 70/88  soft-tissue]
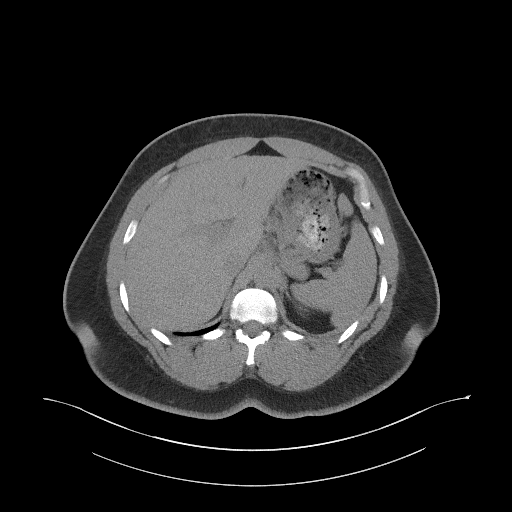
[im 77/88  soft-tissue]
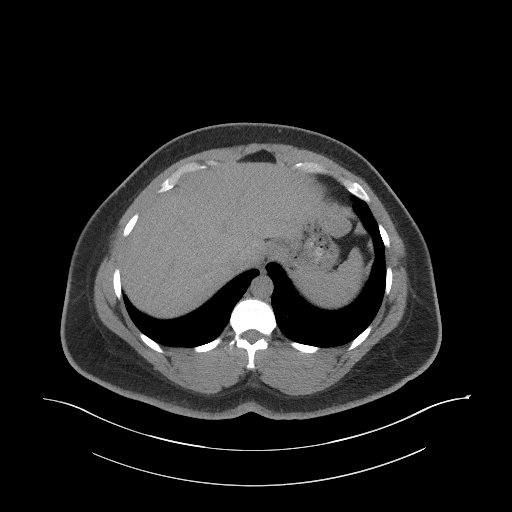
[im 84/88  soft-tissue]
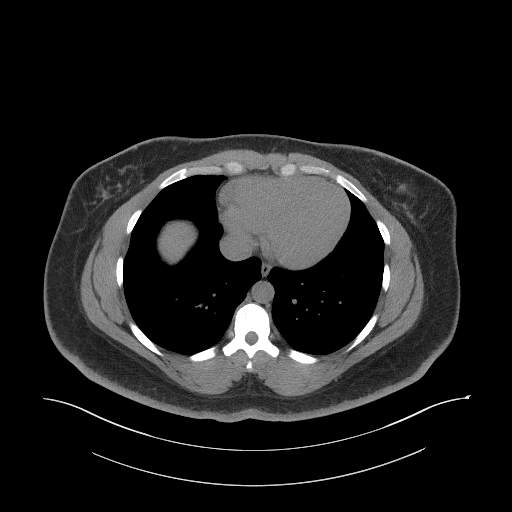

[Series 5: coronal st · coronal · 0.88mm/px · 3 of 104 slices shown]
[im 35/104  soft-tissue]
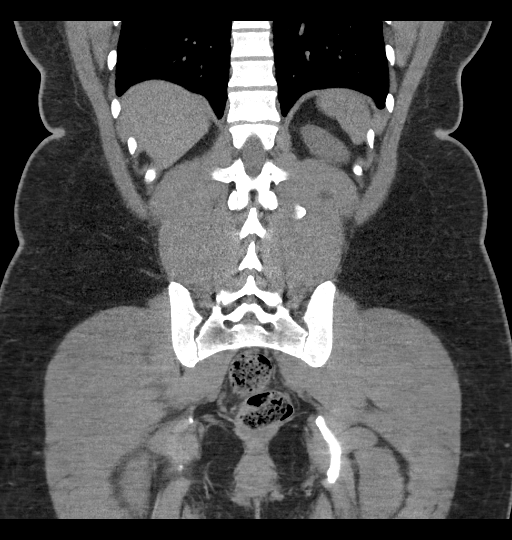
[im 46/104  soft-tissue]
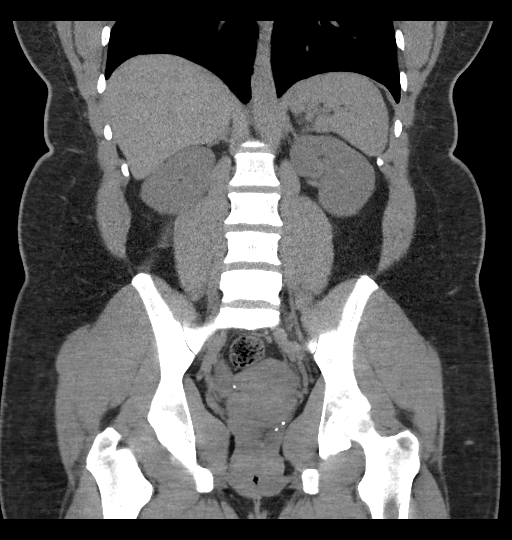
[im 58/104  soft-tissue]
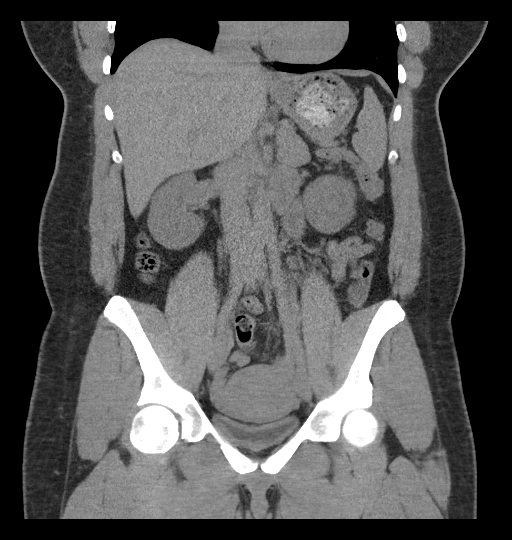

[16 of 46 positions shown; findings below may reference images not displayed]

FINDINGS: Lower chest: No acute abnormality.

Hepatobiliary: No suspicious hepatic lesion. Gallbladder is
unremarkable. No biliary ductal dilation.

Pancreas: No pancreatic ductal dilation or evidence of acute
inflammation.

Spleen: No splenomegaly or focal splenic lesion.

Adrenals/Urinary Tract: Bilateral adrenal glands appear normal. No
hydronephrosis. No renal, ureteral or bladder calculi identified.
The right kidney appears slightly hypodense in comparison to the
left which is nonspecific and may reflect sequela of recently passed
stone, ascending urinary tract infection or artifact. Symmetric wall
thickening of an incompletely distended urinary bladder.

Stomach/Bowel: Stomach is unremarkable for degree of distension. No
pathologic dilation of small or large bowel. The appendix and
terminal ileum appear normal. No evidence of acute bowel
inflammation.

Vascular/Lymphatic: Normal caliber abdominal aorta. No
pathologically enlarged abdominal or pelvic lymph nodes.

Reproductive: Uterus and bilateral adnexa are unremarkable in
noncontrast CT appearance for reproductive age female.

Other: No significant abdominopelvic free fluid.

Musculoskeletal: No acute or significant osseous findings.
IMPRESSION: 1. The right kidney appears slightly hypodense in comparison to the
left which is nonspecific and may reflect sequela of recently passed
stone, ascending urinary tract infection or artifact. Correlation
with urinalysis is recommended.
2. Symmetric wall thickening of an incompletely distended urinary
bladder. Correlate with urinalysis to exclude cystitis.
